# Patient Record
Sex: Male | Born: 1965 | Race: White | Hispanic: No | Marital: Married | Smoking: Never smoker
Health system: Southern US, Community
[De-identification: ages and names within clinical notes are randomized; demographics above are authoritative.]

## PROBLEM LIST (undated history)

## (undated) DIAGNOSIS — Z86010 Personal history of colonic polyps: Principal | ICD-10-CM

## (undated) DIAGNOSIS — J45909 Unspecified asthma, uncomplicated: Secondary | ICD-10-CM

## (undated) DIAGNOSIS — I10 Essential (primary) hypertension: Secondary | ICD-10-CM

## (undated) HISTORY — DX: Personal history of colonic polyps: Z86.010

## (undated) HISTORY — DX: Unspecified asthma, uncomplicated: J45.909

## (undated) HISTORY — DX: Essential (primary) hypertension: I10

## (undated) HISTORY — PX: WISDOM TOOTH EXTRACTION: SHX21

---

## 2003-11-29 HISTORY — PX: BACK SURGERY: SHX140

## 2004-10-12 ENCOUNTER — Inpatient Hospital Stay (HOSPITAL_COMMUNITY): Admission: AD | Admit: 2004-10-12 | Discharge: 2004-10-14 | Payer: Self-pay | Admitting: Neurosurgery

## 2016-05-06 DIAGNOSIS — Z79899 Other long term (current) drug therapy: Secondary | ICD-10-CM | POA: Diagnosis not present

## 2016-05-06 DIAGNOSIS — Z125 Encounter for screening for malignant neoplasm of prostate: Secondary | ICD-10-CM | POA: Diagnosis not present

## 2016-05-06 DIAGNOSIS — E782 Mixed hyperlipidemia: Secondary | ICD-10-CM | POA: Diagnosis not present

## 2016-05-09 DIAGNOSIS — Z0000 Encounter for general adult medical examination without abnormal findings: Secondary | ICD-10-CM | POA: Diagnosis not present

## 2016-05-18 DIAGNOSIS — Z1211 Encounter for screening for malignant neoplasm of colon: Secondary | ICD-10-CM | POA: Diagnosis not present

## 2016-05-27 ENCOUNTER — Encounter: Payer: Self-pay | Admitting: Internal Medicine

## 2016-05-27 DIAGNOSIS — R7301 Impaired fasting glucose: Secondary | ICD-10-CM | POA: Diagnosis not present

## 2016-06-13 DIAGNOSIS — E119 Type 2 diabetes mellitus without complications: Secondary | ICD-10-CM | POA: Diagnosis not present

## 2016-06-13 DIAGNOSIS — Z1389 Encounter for screening for other disorder: Secondary | ICD-10-CM | POA: Diagnosis not present

## 2016-07-20 ENCOUNTER — Ambulatory Visit (AMBULATORY_SURGERY_CENTER): Payer: Self-pay

## 2016-07-20 VITALS — Ht 68.5 in | Wt 189.8 lb

## 2016-07-20 DIAGNOSIS — Z800 Family history of malignant neoplasm of digestive organs: Secondary | ICD-10-CM

## 2016-07-20 NOTE — Progress Notes (Signed)
No allergies to eggs or soy No past problems with anesthesia No diet meds No oxygen at home  Has email and internet; registered emmi

## 2016-07-22 ENCOUNTER — Encounter: Payer: Self-pay | Admitting: Internal Medicine

## 2016-08-03 ENCOUNTER — Encounter: Payer: Self-pay | Admitting: Internal Medicine

## 2016-08-03 ENCOUNTER — Ambulatory Visit (AMBULATORY_SURGERY_CENTER): Payer: BLUE CROSS/BLUE SHIELD | Admitting: Internal Medicine

## 2016-08-03 VITALS — BP 126/87 | HR 75 | Temp 97.8°F | Resp 12 | Ht 68.5 in | Wt 189.0 lb

## 2016-08-03 DIAGNOSIS — D123 Benign neoplasm of transverse colon: Secondary | ICD-10-CM | POA: Diagnosis not present

## 2016-08-03 DIAGNOSIS — Z800 Family history of malignant neoplasm of digestive organs: Secondary | ICD-10-CM | POA: Diagnosis not present

## 2016-08-03 DIAGNOSIS — D120 Benign neoplasm of cecum: Secondary | ICD-10-CM

## 2016-08-03 DIAGNOSIS — Z1211 Encounter for screening for malignant neoplasm of colon: Secondary | ICD-10-CM | POA: Diagnosis not present

## 2016-08-03 MED ORDER — SODIUM CHLORIDE 0.9 % IV SOLN: 500.0000 mL | INTRAVENOUS | Status: AC

## 2016-08-03 NOTE — Progress Notes (Signed)
Called to room to assist during endoscopic procedure.  Patient ID and intended procedure confirmed with present staff. Received instructions for my participation in the procedure from the performing physician.  

## 2016-08-03 NOTE — Op Note (Signed)
[facility] Patient Name: Gregory Buck Procedure Date: 08/03/2016 8:37 AM MRN: [id number] Endoscopist: Gatha Mayer , MD Age: 50 Referring MD:  Date of Birth: [birth year] Gender: Male Account #: [id number] Procedure:                Colonoscopy Indications:              Screening for colorectal malignant neoplasm, This                            is the patient's first colonoscopy Medicines:                Propofol per Anesthesia, Monitored Anesthesia Care Procedure:                Pre-Anesthesia Assessment:                           - Prior to the procedure, a History and Physical                            was performed, and patient medications and                            allergies were reviewed. The patient's tolerance of                            previous anesthesia was also reviewed. The risks                            and benefits of the procedure and the sedation                            options and risks were discussed with the patient.                            All questions were answered, and informed consent                            was obtained. Prior Anticoagulants: The patient has                            taken no previous anticoagulant or antiplatelet                            agents. ASA Grade Assessment: II - A patient with                            mild systemic disease. After reviewing the risks                            and benefits, the patient was deemed in                            satisfactory condition to undergo the procedure.  After obtaining informed consent, the colonoscope                            was passed under direct vision. Throughout the                            procedure, the patient's blood pressure, pulse, and                            oxygen saturations were monitored continuously. The                            Model CF-HQ190L [id number]) scope was introduced   through the anus and advanced to the the cecum,                            identified by appendiceal orifice and ileocecal                            valve. The colonoscopy was performed without                            difficulty. The patient tolerated the procedure                            well. The quality of the bowel preparation was                            excellent. The bowel preparation used was Miralax.                            The ileocecal valve, appendiceal orifice, and                            rectum were photographed. Scope In: 8:55:43 AM Scope Out: 9:14:29 AM Scope Withdrawal Time: 0 hours 15 minutes 26 seconds  Total Procedure Duration: 0 hours 18 minutes 46 seconds  Findings:                 The perianal and digital rectal examinations were                            normal. Pertinent negatives include normal prostate                            (size, shape, and consistency).                           Two sessile polyps were found in the transverse                            colon and cecum. The polyps were 4 to 6 mm in size.                            These polyps  were removed with a cold snare.                            Resection and retrieval were complete. Verification                            of patient identification for the specimen was                            done. Estimated blood loss was minimal.                           Multiple diverticula were found in the sigmoid                            colon, transverse colon and ascending colon. There                            was no evidence of diverticular bleeding.                           The exam was otherwise without abnormality on                            direct and retroflexion views. Complications:            No immediate complications. Estimated Blood Loss:     Estimated blood loss was minimal. Impression:               - Two 4 to 6 mm polyps in the transverse colon and                             in the cecum, removed with a cold snare. Resected                            and retrieved.                           - Mild diverticulosis in the sigmoid colon, in the                            transverse colon and in the ascending colon. There                            was no evidence of diverticular bleeding.                           - The examination was otherwise normal on direct                            and retroflexion views. Recommendation:           - Patient has a contact number available for  emergencies. The signs and symptoms of potential                            delayed complications were discussed with the                            patient. Return to normal activities tomorrow.                            Written discharge instructions were provided to the                            patient.                           - Resume previous diet.                           - Continue present medications.                           - Repeat colonoscopy is recommended. The                            colonoscopy date will be determined after pathology                            results from today's exam become available for                            review. Gatha Mayer, MD 08/03/2016 9:21:56 AM This report has been signed electronically.

## 2016-08-03 NOTE — Progress Notes (Signed)
A/ox3 pleased with MAC, report to Wendy RN 

## 2016-08-03 NOTE — Patient Instructions (Addendum)
I found and removed 2 small polyps that look benign. I will let you know pathology results and when to have another routine colonoscopy by mail.  You also have a condition called diverticulosis - common and not usually a problem. Please read the handout provided.  I appreciate the opportunity to care for you. Gatha Mayer, MD, FACG  YOU HAD AN ENDOSCOPIC PROCEDURE TODAY AT [facility]:   Refer to the procedure report that was given to you for any specific questions about what was found during the examination.  If the procedure report does not answer your questions, please call your gastroenterologist to clarify.  If you requested that your care partner not be given the details of your procedure findings, then the procedure report has been included in a sealed envelope for you to review at your convenience later.  YOU SHOULD EXPECT: Some feelings of bloating in the abdomen. Passage of more gas than usual.  Walking can help get rid of the air that was put into your GI tract during the procedure and reduce the bloating. If you had a lower endoscopy (such as a colonoscopy or flexible sigmoidoscopy) you may notice spotting of blood in your stool or on the toilet paper. If you underwent a bowel prep for your procedure, you may not have a normal bowel movement for a few days.  Please Note:  You might notice some irritation and congestion in your nose or some drainage.  This is from the oxygen used during your procedure.  There is no need for concern and it should clear up in a day or so.  SYMPTOMS TO REPORT IMMEDIATELY:   Following lower endoscopy (colonoscopy or flexible sigmoidoscopy):  Excessive amounts of blood in the stool  Significant tenderness or worsening of abdominal pains  Swelling of the abdomen that is new, acute  Fever of 100F or higher  For urgent or emergent issues, a gastroenterologist can be reached at any hour by calling [phone].   DIET:  We  do recommend a small meal at first, but then you may proceed to your regular diet.  Drink plenty of fluids but you should avoid alcoholic beverages for 24 hours.  ACTIVITY:  You should plan to take it easy for the rest of today and you should NOT DRIVE or use heavy machinery until tomorrow (because of the sedation medicines used during the test).    FOLLOW UP: Our staff will call the number listed on your records the next business day following your procedure to check on you and address any questions or concerns that you may have regarding the information given to you following your procedure. If we do not reach you, we will leave a message.  However, if you are feeling well and you are not experiencing any problems, there is no need to return our call.  We will assume that you have returned to your regular daily activities without incident.  If any biopsies were taken you will be contacted by phone or by letter within the next 1-3 weeks.  Please call us at [phone] if you have not heard about the biopsies in 3 weeks.    SIGNATURES/CONFIDENTIALITY: You and/or your care partner have signed paperwork which will be entered into your electronic medical record.  These signatures attest to the fact that that the information above on your After Visit Summary has been reviewed and is understood.  Full responsibility of the confidentiality of this discharge information lies  with you and/or your care-partner.  Please read polyp and diverticulosis handouts provided.

## 2016-08-04 ENCOUNTER — Telehealth: Payer: Self-pay

## 2016-08-04 NOTE — Telephone Encounter (Signed)
  Follow up Call-  Call back number 08/03/2016  Post procedure Call Back phone  # [phone]  Permission to leave phone message Yes  Some recent data might be hidden     Patient questions:  Do you have a fever, pain , or abdominal swelling? No. Pain Score  0 *  Have you tolerated food without any problems? Yes.    Have you been able to return to your normal activities? Yes.    Do you have any questions about your discharge instructions: Diet   No. Medications  No. Follow up visit  No.  Do you have questions or concerns about your Care? No.  Actions: * If pain score is 4 or above: No action needed, pain <4.  Pt thanked [place] for doing a great job.  No problems per the pt. maw

## 2016-08-10 ENCOUNTER — Encounter: Payer: Self-pay | Admitting: Internal Medicine

## 2016-08-10 DIAGNOSIS — Z86010 Personal history of colonic polyps: Secondary | ICD-10-CM

## 2016-08-10 HISTORY — DX: Personal history of colonic polyps: Z86.010

## 2016-08-10 NOTE — Progress Notes (Signed)
Diminutive adenoma plus sessile serrated polyp Recall colonoscopy 2022

## 2016-09-07 DIAGNOSIS — Z23 Encounter for immunization: Secondary | ICD-10-CM | POA: Diagnosis not present

## 2016-11-23 DIAGNOSIS — J040 Acute laryngitis: Secondary | ICD-10-CM | POA: Diagnosis not present

## 2016-11-23 DIAGNOSIS — J209 Acute bronchitis, unspecified: Secondary | ICD-10-CM | POA: Diagnosis not present

## 2016-12-09 DIAGNOSIS — Z79899 Other long term (current) drug therapy: Secondary | ICD-10-CM | POA: Diagnosis not present

## 2016-12-09 DIAGNOSIS — E782 Mixed hyperlipidemia: Secondary | ICD-10-CM | POA: Diagnosis not present

## 2016-12-09 DIAGNOSIS — R7301 Impaired fasting glucose: Secondary | ICD-10-CM | POA: Diagnosis not present

## 2017-01-04 DIAGNOSIS — E119 Type 2 diabetes mellitus without complications: Secondary | ICD-10-CM | POA: Diagnosis not present

## 2017-07-14 DIAGNOSIS — E782 Mixed hyperlipidemia: Secondary | ICD-10-CM | POA: Diagnosis not present

## 2017-07-14 DIAGNOSIS — R7301 Impaired fasting glucose: Secondary | ICD-10-CM | POA: Diagnosis not present

## 2017-07-14 DIAGNOSIS — Z79899 Other long term (current) drug therapy: Secondary | ICD-10-CM | POA: Diagnosis not present

## 2017-07-20 DIAGNOSIS — E1165 Type 2 diabetes mellitus with hyperglycemia: Secondary | ICD-10-CM | POA: Diagnosis not present

## 2017-07-20 DIAGNOSIS — Z6828 Body mass index (BMI) 28.0-28.9, adult: Secondary | ICD-10-CM | POA: Diagnosis not present

## 2017-07-20 DIAGNOSIS — I10 Essential (primary) hypertension: Secondary | ICD-10-CM | POA: Diagnosis not present

## 2017-09-08 DIAGNOSIS — Z23 Encounter for immunization: Secondary | ICD-10-CM | POA: Diagnosis not present

## 2017-12-01 DIAGNOSIS — J00 Acute nasopharyngitis [common cold]: Secondary | ICD-10-CM | POA: Diagnosis not present

## 2017-12-01 DIAGNOSIS — J45909 Unspecified asthma, uncomplicated: Secondary | ICD-10-CM | POA: Diagnosis not present

## 2017-12-01 DIAGNOSIS — R05 Cough: Secondary | ICD-10-CM | POA: Diagnosis not present

## 2018-02-02 DIAGNOSIS — E1165 Type 2 diabetes mellitus with hyperglycemia: Secondary | ICD-10-CM | POA: Diagnosis not present

## 2018-02-02 DIAGNOSIS — I10 Essential (primary) hypertension: Secondary | ICD-10-CM | POA: Diagnosis not present

## 2018-02-02 DIAGNOSIS — Z79899 Other long term (current) drug therapy: Secondary | ICD-10-CM | POA: Diagnosis not present

## 2018-02-02 DIAGNOSIS — Z125 Encounter for screening for malignant neoplasm of prostate: Secondary | ICD-10-CM | POA: Diagnosis not present

## 2018-02-02 DIAGNOSIS — Z1331 Encounter for screening for depression: Secondary | ICD-10-CM | POA: Diagnosis not present

## 2018-08-09 DIAGNOSIS — J0100 Acute maxillary sinusitis, unspecified: Secondary | ICD-10-CM | POA: Diagnosis not present

## 2018-08-16 DIAGNOSIS — E1165 Type 2 diabetes mellitus with hyperglycemia: Secondary | ICD-10-CM | POA: Diagnosis not present

## 2018-08-21 DIAGNOSIS — Z6828 Body mass index (BMI) 28.0-28.9, adult: Secondary | ICD-10-CM | POA: Diagnosis not present

## 2018-08-21 DIAGNOSIS — E1165 Type 2 diabetes mellitus with hyperglycemia: Secondary | ICD-10-CM | POA: Diagnosis not present

## 2018-08-21 DIAGNOSIS — I10 Essential (primary) hypertension: Secondary | ICD-10-CM | POA: Diagnosis not present

## 2018-08-30 DIAGNOSIS — Z23 Encounter for immunization: Secondary | ICD-10-CM | POA: Diagnosis not present

## 2019-02-11 DIAGNOSIS — J324 Chronic pansinusitis: Secondary | ICD-10-CM | POA: Diagnosis not present

## 2019-03-07 DIAGNOSIS — Z23 Encounter for immunization: Secondary | ICD-10-CM | POA: Diagnosis not present

## 2019-03-07 DIAGNOSIS — Z131 Encounter for screening for diabetes mellitus: Secondary | ICD-10-CM | POA: Diagnosis not present

## 2019-03-07 DIAGNOSIS — Z125 Encounter for screening for malignant neoplasm of prostate: Secondary | ICD-10-CM | POA: Diagnosis not present

## 2019-03-07 DIAGNOSIS — Z0000 Encounter for general adult medical examination without abnormal findings: Secondary | ICD-10-CM | POA: Diagnosis not present

## 2019-03-07 DIAGNOSIS — E663 Overweight: Secondary | ICD-10-CM | POA: Diagnosis not present

## 2019-03-07 DIAGNOSIS — Z13220 Encounter for screening for lipoid disorders: Secondary | ICD-10-CM | POA: Diagnosis not present

## 2019-09-19 DIAGNOSIS — R739 Hyperglycemia, unspecified: Secondary | ICD-10-CM | POA: Diagnosis not present

## 2019-09-19 DIAGNOSIS — E782 Mixed hyperlipidemia: Secondary | ICD-10-CM | POA: Diagnosis not present

## 2019-09-27 DIAGNOSIS — Z23 Encounter for immunization: Secondary | ICD-10-CM | POA: Diagnosis not present

## 2019-09-27 DIAGNOSIS — J4520 Mild intermittent asthma, uncomplicated: Secondary | ICD-10-CM | POA: Diagnosis not present

## 2019-09-27 DIAGNOSIS — E782 Mixed hyperlipidemia: Secondary | ICD-10-CM | POA: Diagnosis not present

## 2019-09-27 DIAGNOSIS — E1165 Type 2 diabetes mellitus with hyperglycemia: Secondary | ICD-10-CM | POA: Diagnosis not present

## 2019-09-27 DIAGNOSIS — I10 Essential (primary) hypertension: Secondary | ICD-10-CM | POA: Diagnosis not present

## 2020-03-31 DIAGNOSIS — Z125 Encounter for screening for malignant neoplasm of prostate: Secondary | ICD-10-CM | POA: Diagnosis not present

## 2020-03-31 DIAGNOSIS — Z0000 Encounter for general adult medical examination without abnormal findings: Secondary | ICD-10-CM | POA: Diagnosis not present

## 2020-03-31 DIAGNOSIS — Z131 Encounter for screening for diabetes mellitus: Secondary | ICD-10-CM | POA: Diagnosis not present

## 2020-03-31 DIAGNOSIS — Z13220 Encounter for screening for lipoid disorders: Secondary | ICD-10-CM | POA: Diagnosis not present

## 2020-03-31 DIAGNOSIS — E663 Overweight: Secondary | ICD-10-CM | POA: Diagnosis not present

## 2020-08-08 DIAGNOSIS — R0981 Nasal congestion: Secondary | ICD-10-CM | POA: Diagnosis not present

## 2020-08-08 DIAGNOSIS — Z20828 Contact with and (suspected) exposure to other viral communicable diseases: Secondary | ICD-10-CM | POA: Diagnosis not present

## 2020-08-09 ENCOUNTER — Other Ambulatory Visit: Payer: Self-pay | Admitting: Unknown Physician Specialty

## 2020-08-09 ENCOUNTER — Telehealth: Payer: Self-pay | Admitting: Internal Medicine

## 2020-08-09 ENCOUNTER — Telehealth: Payer: Self-pay | Admitting: Unknown Physician Specialty

## 2020-08-09 DIAGNOSIS — U071 COVID-19: Secondary | ICD-10-CM

## 2020-08-09 DIAGNOSIS — I10 Essential (primary) hypertension: Secondary | ICD-10-CM

## 2020-08-09 NOTE — Telephone Encounter (Signed)
Called to Discuss with patient about Covid symptoms and the use of the monoclonal antibody infusion for those with mild to moderate Covid symptoms and at a high risk of hospitalization.     Pt appears to qualify for this infusion due to co-morbid conditions and/or a member of an at-risk group in accordance with the FDA Emergency Use Authorization.    Unable to reach pt . Left VM.  Nemiah Commander, NP

## 2020-08-09 NOTE — Telephone Encounter (Signed)
I connected by phone with Gregory Buck on 08/09/2020 at 2:15 PM to discuss the potential use of a new treatment for mild to moderate COVID-19 viral infection in non-hospitalized patients.  This patient is a 54 y.o. male that meets the FDA criteria for Emergency Use Authorization of COVID monoclonal antibody casirivimab/imdevimab.  Has a (+) direct SARS-CoV-2 viral test result  Has mild or moderate COVID-19   Is NOT hospitalized due to COVID-19  Is within 10 days of symptom onset  Has at least one of the high risk factor(s) for progression to severe COVID-19 and/or hospitalization as defined in EUA.  Specific high risk criteria : BMI > 25 and Cardiovascular disease or hypertension   I have spoken and communicated the following to the patient or parent/caregiver regarding COVID monoclonal antibody treatment:  1. FDA has authorized the emergency use for the treatment of mild to moderate COVID-19 in adults and pediatric patients with positive results of direct SARS-CoV-2 viral testing who are 43 years of age and older weighing at least 40 kg, and who are at high risk for progressing to severe COVID-19 and/or hospitalization.  2. The significant known and potential risks and benefits of COVID monoclonal antibody, and the extent to which such potential risks and benefits are unknown.  3. Information on available alternative treatments and the risks and benefits of those alternatives, including clinical trials.  4. Patients treated with COVID monoclonal antibody should continue to self-isolate and use infection control measures (e.g., wear mask, isolate, social distance, avoid sharing personal items, clean and disinfect "high touch" surfaces, and frequent handwashing) according to CDC guidelines.   5. The patient or parent/caregiver has the option to accept or refuse COVID monoclonal antibody treatment.  After reviewing this information with the patient, The patient agreed to proceed with  receiving casirivimab\imdevimab infusion and will be provided a copy of the Fact sheet prior to receiving the infusion. Kathrine Haddock 08/09/2020 2:15 PM  Sx onset 9/6

## 2020-08-10 ENCOUNTER — Other Ambulatory Visit (HOSPITAL_COMMUNITY): Payer: Self-pay

## 2020-08-10 ENCOUNTER — Ambulatory Visit (HOSPITAL_COMMUNITY)
Admission: RE | Admit: 2020-08-10 | Discharge: 2020-08-10 | Disposition: A | Discharge status: Home / Self Care | Payer: BC Managed Care – PPO | Source: Ambulatory Visit | Attending: Pulmonary Disease | Admitting: Pulmonary Disease

## 2020-08-10 DIAGNOSIS — I10 Essential (primary) hypertension: Secondary | ICD-10-CM

## 2020-08-10 DIAGNOSIS — U071 COVID-19: Secondary | ICD-10-CM

## 2020-08-10 MED ORDER — FAMOTIDINE IN NACL 20-0.9 MG/50ML-% IV SOLN: 20.0000 mg | Freq: Once | INTRAVENOUS | Status: DC | PRN

## 2020-08-10 MED ORDER — ALBUTEROL SULFATE HFA 108 (90 BASE) MCG/ACT IN AERS: 2.0000 | INHALATION_SPRAY | Freq: Once | RESPIRATORY_TRACT | Status: DC | PRN

## 2020-08-10 MED ORDER — METHYLPREDNISOLONE SODIUM SUCC 125 MG IJ SOLR: 125.0000 mg | Freq: Once | INTRAMUSCULAR | Status: DC | PRN

## 2020-08-10 MED ORDER — SODIUM CHLORIDE 0.9 % IV SOLN
1200.0000 mg | Freq: Once | INTRAVENOUS | Status: AC
  Administered 2020-08-10: 1200 mg via INTRAVENOUS
  Filled 2020-08-10: qty 10

## 2020-08-10 MED ORDER — SODIUM CHLORIDE 0.9 % IV SOLN: INTRAVENOUS | Status: DC | PRN

## 2020-08-10 MED ORDER — DIPHENHYDRAMINE HCL 50 MG/ML IJ SOLN: 50.0000 mg | Freq: Once | INTRAMUSCULAR | Status: DC | PRN

## 2020-08-10 MED ORDER — EPINEPHRINE 0.3 MG/0.3ML IJ SOAJ: 0.3000 mg | Freq: Once | INTRAMUSCULAR | Status: DC | PRN

## 2020-08-10 NOTE — Progress Notes (Signed)
  Diagnosis: COVID-19  Physician: Dr. Asencion Noble  Procedure: [facility]fusion [facility] Med: casirivimab\imdevimab infusion - Provided patient with casirivimab\imdevimab fact sheet for patients, parents and caregivers prior to infusion.  Complications: No immediate complications noted.  Discharge: Discharged home   [place] 08/10/2020

## 2020-08-10 NOTE — Discharge Instructions (Signed)

## 2020-10-01 DIAGNOSIS — E782 Mixed hyperlipidemia: Secondary | ICD-10-CM | POA: Diagnosis not present

## 2020-10-01 DIAGNOSIS — E1165 Type 2 diabetes mellitus with hyperglycemia: Secondary | ICD-10-CM | POA: Diagnosis not present

## 2020-10-13 DIAGNOSIS — I10 Essential (primary) hypertension: Secondary | ICD-10-CM | POA: Diagnosis not present

## 2020-10-13 DIAGNOSIS — E1169 Type 2 diabetes mellitus with other specified complication: Secondary | ICD-10-CM | POA: Diagnosis not present

## 2020-10-13 DIAGNOSIS — E782 Mixed hyperlipidemia: Secondary | ICD-10-CM | POA: Diagnosis not present

## 2020-10-13 DIAGNOSIS — Z23 Encounter for immunization: Secondary | ICD-10-CM | POA: Diagnosis not present

## 2020-10-13 DIAGNOSIS — E785 Hyperlipidemia, unspecified: Secondary | ICD-10-CM | POA: Diagnosis not present

## 2021-05-27 DIAGNOSIS — E785 Hyperlipidemia, unspecified: Secondary | ICD-10-CM | POA: Diagnosis not present

## 2021-05-27 DIAGNOSIS — E782 Mixed hyperlipidemia: Secondary | ICD-10-CM | POA: Diagnosis not present

## 2021-05-27 DIAGNOSIS — E1169 Type 2 diabetes mellitus with other specified complication: Secondary | ICD-10-CM | POA: Diagnosis not present

## 2021-05-27 DIAGNOSIS — Z79899 Other long term (current) drug therapy: Secondary | ICD-10-CM | POA: Diagnosis not present

## 2021-05-27 DIAGNOSIS — Z125 Encounter for screening for malignant neoplasm of prostate: Secondary | ICD-10-CM | POA: Diagnosis not present

## 2021-06-01 DIAGNOSIS — E1169 Type 2 diabetes mellitus with other specified complication: Secondary | ICD-10-CM | POA: Diagnosis not present

## 2021-06-01 DIAGNOSIS — E782 Mixed hyperlipidemia: Secondary | ICD-10-CM | POA: Diagnosis not present

## 2021-06-01 DIAGNOSIS — I10 Essential (primary) hypertension: Secondary | ICD-10-CM | POA: Diagnosis not present

## 2021-06-01 DIAGNOSIS — E785 Hyperlipidemia, unspecified: Secondary | ICD-10-CM | POA: Diagnosis not present

## 2021-10-14 DIAGNOSIS — R0981 Nasal congestion: Secondary | ICD-10-CM | POA: Diagnosis not present

## 2021-10-14 DIAGNOSIS — R5383 Other fatigue: Secondary | ICD-10-CM | POA: Diagnosis not present

## 2021-10-14 DIAGNOSIS — Z20828 Contact with and (suspected) exposure to other viral communicable diseases: Secondary | ICD-10-CM | POA: Diagnosis not present

## 2021-10-14 DIAGNOSIS — R059 Cough, unspecified: Secondary | ICD-10-CM | POA: Diagnosis not present

## 2021-10-14 DIAGNOSIS — J0100 Acute maxillary sinusitis, unspecified: Secondary | ICD-10-CM | POA: Diagnosis not present

## 2021-10-25 DIAGNOSIS — R0989 Other specified symptoms and signs involving the circulatory and respiratory systems: Secondary | ICD-10-CM | POA: Diagnosis not present

## 2021-10-25 DIAGNOSIS — M79602 Pain in left arm: Secondary | ICD-10-CM | POA: Diagnosis not present

## 2021-10-25 DIAGNOSIS — J209 Acute bronchitis, unspecified: Secondary | ICD-10-CM | POA: Diagnosis not present

## 2021-11-09 DIAGNOSIS — Z23 Encounter for immunization: Secondary | ICD-10-CM | POA: Diagnosis not present

## 2021-12-02 DIAGNOSIS — E1169 Type 2 diabetes mellitus with other specified complication: Secondary | ICD-10-CM | POA: Diagnosis not present

## 2021-12-02 DIAGNOSIS — E782 Mixed hyperlipidemia: Secondary | ICD-10-CM | POA: Diagnosis not present

## 2021-12-08 DIAGNOSIS — E782 Mixed hyperlipidemia: Secondary | ICD-10-CM | POA: Diagnosis not present

## 2021-12-08 DIAGNOSIS — E785 Hyperlipidemia, unspecified: Secondary | ICD-10-CM | POA: Diagnosis not present

## 2021-12-08 DIAGNOSIS — I10 Essential (primary) hypertension: Secondary | ICD-10-CM | POA: Diagnosis not present

## 2021-12-08 DIAGNOSIS — E1169 Type 2 diabetes mellitus with other specified complication: Secondary | ICD-10-CM | POA: Diagnosis not present

## 2022-01-26 ENCOUNTER — Encounter: Payer: Self-pay | Admitting: Internal Medicine

## 2022-04-29 DIAGNOSIS — E1169 Type 2 diabetes mellitus with other specified complication: Secondary | ICD-10-CM | POA: Diagnosis not present

## 2022-08-25 DIAGNOSIS — E1169 Type 2 diabetes mellitus with other specified complication: Secondary | ICD-10-CM | POA: Diagnosis not present

## 2022-08-25 DIAGNOSIS — E785 Hyperlipidemia, unspecified: Secondary | ICD-10-CM | POA: Diagnosis not present

## 2022-08-25 DIAGNOSIS — Z125 Encounter for screening for malignant neoplasm of prostate: Secondary | ICD-10-CM | POA: Diagnosis not present

## 2022-08-29 DIAGNOSIS — Z0000 Encounter for general adult medical examination without abnormal findings: Secondary | ICD-10-CM | POA: Diagnosis not present

## 2022-08-29 DIAGNOSIS — Z1331 Encounter for screening for depression: Secondary | ICD-10-CM | POA: Diagnosis not present

## 2022-08-29 DIAGNOSIS — E663 Overweight: Secondary | ICD-10-CM | POA: Diagnosis not present

## 2022-08-29 DIAGNOSIS — Z6827 Body mass index (BMI) 27.0-27.9, adult: Secondary | ICD-10-CM | POA: Diagnosis not present

## 2022-08-29 DIAGNOSIS — Z23 Encounter for immunization: Secondary | ICD-10-CM | POA: Diagnosis not present

## 2022-11-02 DIAGNOSIS — R509 Fever, unspecified: Secondary | ICD-10-CM | POA: Diagnosis not present

## 2022-11-02 DIAGNOSIS — R0981 Nasal congestion: Secondary | ICD-10-CM | POA: Diagnosis not present

## 2022-11-02 DIAGNOSIS — J209 Acute bronchitis, unspecified: Secondary | ICD-10-CM | POA: Diagnosis not present

## 2022-11-02 DIAGNOSIS — R051 Acute cough: Secondary | ICD-10-CM | POA: Diagnosis not present

## 2022-12-29 DIAGNOSIS — E1169 Type 2 diabetes mellitus with other specified complication: Secondary | ICD-10-CM | POA: Diagnosis not present

## 2022-12-29 DIAGNOSIS — E785 Hyperlipidemia, unspecified: Secondary | ICD-10-CM | POA: Diagnosis not present

## 2022-12-29 DIAGNOSIS — E782 Mixed hyperlipidemia: Secondary | ICD-10-CM | POA: Diagnosis not present

## 2023-01-02 DIAGNOSIS — E785 Hyperlipidemia, unspecified: Secondary | ICD-10-CM | POA: Diagnosis not present

## 2023-01-02 DIAGNOSIS — E1169 Type 2 diabetes mellitus with other specified complication: Secondary | ICD-10-CM | POA: Diagnosis not present

## 2023-01-02 DIAGNOSIS — I10 Essential (primary) hypertension: Secondary | ICD-10-CM | POA: Diagnosis not present

## 2023-01-02 DIAGNOSIS — E782 Mixed hyperlipidemia: Secondary | ICD-10-CM | POA: Diagnosis not present

## 2023-04-27 DIAGNOSIS — E1169 Type 2 diabetes mellitus with other specified complication: Secondary | ICD-10-CM | POA: Diagnosis not present

## 2023-04-27 DIAGNOSIS — E782 Mixed hyperlipidemia: Secondary | ICD-10-CM | POA: Diagnosis not present

## 2023-04-27 DIAGNOSIS — E785 Hyperlipidemia, unspecified: Secondary | ICD-10-CM | POA: Diagnosis not present

## 2023-05-04 DIAGNOSIS — E782 Mixed hyperlipidemia: Secondary | ICD-10-CM | POA: Diagnosis not present

## 2023-05-04 DIAGNOSIS — E785 Hyperlipidemia, unspecified: Secondary | ICD-10-CM | POA: Diagnosis not present

## 2023-05-04 DIAGNOSIS — I10 Essential (primary) hypertension: Secondary | ICD-10-CM | POA: Diagnosis not present

## 2023-05-04 DIAGNOSIS — E1169 Type 2 diabetes mellitus with other specified complication: Secondary | ICD-10-CM | POA: Diagnosis not present

## 2023-06-21 DIAGNOSIS — M47816 Spondylosis without myelopathy or radiculopathy, lumbar region: Secondary | ICD-10-CM | POA: Diagnosis not present

## 2023-06-21 DIAGNOSIS — Z6827 Body mass index (BMI) 27.0-27.9, adult: Secondary | ICD-10-CM | POA: Diagnosis not present

## 2023-06-21 DIAGNOSIS — K5901 Slow transit constipation: Secondary | ICD-10-CM | POA: Diagnosis not present

## 2023-07-12 DIAGNOSIS — E119 Type 2 diabetes mellitus without complications: Secondary | ICD-10-CM | POA: Diagnosis not present

## 2023-07-12 DIAGNOSIS — K8010 Calculus of gallbladder with chronic cholecystitis without obstruction: Secondary | ICD-10-CM | POA: Diagnosis not present

## 2023-07-12 DIAGNOSIS — R1011 Right upper quadrant pain: Secondary | ICD-10-CM | POA: Diagnosis not present

## 2023-07-24 DIAGNOSIS — R1011 Right upper quadrant pain: Secondary | ICD-10-CM | POA: Diagnosis not present

## 2023-07-24 DIAGNOSIS — E119 Type 2 diabetes mellitus without complications: Secondary | ICD-10-CM | POA: Diagnosis not present

## 2023-07-24 DIAGNOSIS — K8010 Calculus of gallbladder with chronic cholecystitis without obstruction: Secondary | ICD-10-CM | POA: Diagnosis not present

## 2023-07-25 DIAGNOSIS — Z01818 Encounter for other preprocedural examination: Secondary | ICD-10-CM | POA: Diagnosis not present

## 2023-09-28 DIAGNOSIS — E782 Mixed hyperlipidemia: Secondary | ICD-10-CM | POA: Diagnosis not present

## 2023-09-28 DIAGNOSIS — E785 Hyperlipidemia, unspecified: Secondary | ICD-10-CM | POA: Diagnosis not present

## 2023-09-28 DIAGNOSIS — E1169 Type 2 diabetes mellitus with other specified complication: Secondary | ICD-10-CM | POA: Diagnosis not present

## 2023-09-28 DIAGNOSIS — Z125 Encounter for screening for malignant neoplasm of prostate: Secondary | ICD-10-CM | POA: Diagnosis not present

## 2023-10-05 DIAGNOSIS — Z23 Encounter for immunization: Secondary | ICD-10-CM | POA: Diagnosis not present

## 2023-10-05 DIAGNOSIS — E663 Overweight: Secondary | ICD-10-CM | POA: Diagnosis not present

## 2023-10-05 DIAGNOSIS — Z0000 Encounter for general adult medical examination without abnormal findings: Secondary | ICD-10-CM | POA: Diagnosis not present

## 2023-10-05 DIAGNOSIS — Z6827 Body mass index (BMI) 27.0-27.9, adult: Secondary | ICD-10-CM | POA: Diagnosis not present

## 2023-10-09 DIAGNOSIS — E119 Type 2 diabetes mellitus without complications: Secondary | ICD-10-CM | POA: Diagnosis not present

## 2024-01-25 DIAGNOSIS — L723 Sebaceous cyst: Secondary | ICD-10-CM | POA: Diagnosis not present

## 2024-04-19 DIAGNOSIS — E782 Mixed hyperlipidemia: Secondary | ICD-10-CM | POA: Diagnosis not present

## 2024-04-19 DIAGNOSIS — E785 Hyperlipidemia, unspecified: Secondary | ICD-10-CM | POA: Diagnosis not present

## 2024-04-19 DIAGNOSIS — Z125 Encounter for screening for malignant neoplasm of prostate: Secondary | ICD-10-CM | POA: Diagnosis not present

## 2024-04-19 DIAGNOSIS — E1169 Type 2 diabetes mellitus with other specified complication: Secondary | ICD-10-CM | POA: Diagnosis not present

## 2024-04-24 DIAGNOSIS — E663 Overweight: Secondary | ICD-10-CM | POA: Diagnosis not present

## 2024-04-24 DIAGNOSIS — E1169 Type 2 diabetes mellitus with other specified complication: Secondary | ICD-10-CM | POA: Diagnosis not present

## 2024-04-24 DIAGNOSIS — Z0000 Encounter for general adult medical examination without abnormal findings: Secondary | ICD-10-CM | POA: Diagnosis not present

## 2024-04-24 DIAGNOSIS — E785 Hyperlipidemia, unspecified: Secondary | ICD-10-CM | POA: Diagnosis not present

## 2024-08-16 DIAGNOSIS — R972 Elevated prostate specific antigen [PSA]: Secondary | ICD-10-CM | POA: Diagnosis not present

## 2024-08-16 DIAGNOSIS — E785 Hyperlipidemia, unspecified: Secondary | ICD-10-CM | POA: Diagnosis not present

## 2024-08-16 DIAGNOSIS — E1169 Type 2 diabetes mellitus with other specified complication: Secondary | ICD-10-CM | POA: Diagnosis not present

## 2024-08-20 DIAGNOSIS — E1169 Type 2 diabetes mellitus with other specified complication: Secondary | ICD-10-CM | POA: Diagnosis not present

## 2024-08-20 DIAGNOSIS — E785 Hyperlipidemia, unspecified: Secondary | ICD-10-CM | POA: Diagnosis not present

## 2024-08-20 DIAGNOSIS — E782 Mixed hyperlipidemia: Secondary | ICD-10-CM | POA: Diagnosis not present

## 2024-08-20 DIAGNOSIS — Z23 Encounter for immunization: Secondary | ICD-10-CM | POA: Diagnosis not present

## 2024-08-20 DIAGNOSIS — I10 Essential (primary) hypertension: Secondary | ICD-10-CM | POA: Diagnosis not present

## 2024-10-02 DIAGNOSIS — M791 Myalgia, unspecified site: Secondary | ICD-10-CM | POA: Diagnosis not present

## 2024-10-02 DIAGNOSIS — R059 Cough, unspecified: Secondary | ICD-10-CM | POA: Diagnosis not present

## 2024-10-02 DIAGNOSIS — R0981 Nasal congestion: Secondary | ICD-10-CM | POA: Diagnosis not present
